# Patient Record
Sex: Male | Born: 1980 | Race: White | Hispanic: No | Marital: Single | Smoking: Current every day smoker
Health system: Southern US, Community
[De-identification: ages and names within clinical notes are randomized; demographics above are authoritative.]

## PROBLEM LIST (undated history)

## (undated) DIAGNOSIS — F209 Schizophrenia, unspecified: Secondary | ICD-10-CM

## (undated) DIAGNOSIS — T1491XA Suicide attempt, initial encounter: Secondary | ICD-10-CM

## (undated) DIAGNOSIS — I2510 Atherosclerotic heart disease of native coronary artery without angina pectoris: Secondary | ICD-10-CM

## (undated) DIAGNOSIS — F319 Bipolar disorder, unspecified: Secondary | ICD-10-CM

---

## 2004-03-03 ENCOUNTER — Emergency Department: Payer: Self-pay | Admitting: Emergency Medicine

## 2004-11-04 ENCOUNTER — Emergency Department: Payer: Self-pay | Admitting: Unknown Physician Specialty

## 2004-11-05 ENCOUNTER — Emergency Department: Payer: Self-pay | Admitting: Emergency Medicine

## 2004-11-07 ENCOUNTER — Emergency Department: Payer: Self-pay | Admitting: Emergency Medicine

## 2006-06-25 ENCOUNTER — Emergency Department: Payer: Self-pay | Admitting: Emergency Medicine

## 2007-04-09 ENCOUNTER — Emergency Department: Payer: Self-pay | Admitting: Emergency Medicine

## 2008-02-12 ENCOUNTER — Inpatient Hospital Stay: Payer: Self-pay | Admitting: General Practice

## 2008-02-12 ENCOUNTER — Other Ambulatory Visit: Payer: Self-pay

## 2008-02-12 ENCOUNTER — Ambulatory Visit: Payer: Self-pay | Admitting: Internal Medicine

## 2008-02-21 ENCOUNTER — Ambulatory Visit: Payer: Self-pay | Admitting: Podiatry

## 2008-02-22 ENCOUNTER — Ambulatory Visit: Payer: Self-pay | Admitting: Podiatry

## 2009-05-29 ENCOUNTER — Inpatient Hospital Stay: Payer: Self-pay | Admitting: Psychiatry

## 2009-06-25 ENCOUNTER — Inpatient Hospital Stay: Payer: Self-pay | Admitting: Psychiatry

## 2009-09-29 IMAGING — CT CT EXTREM LOW W/O CM*R*
1 of 2 series · 9 of 14 positions shown, 12 images · non-contrast
Comparison: none

REASON FOR EXAM: fx; 3D reconstruction please
COMMENTS:

PROCEDURE:     CT  - CT HEEL (CALCANEUS) R WO  - February 12, 2008  [DATE]
RESULT:
HISTORY: Fracture.

[Series 3: bone windows · axial · 0.45mm/px · z∈[+22,+208]mm · 9 of 78 slices shown, 12 images]
[im 8/78  soft-tissue]
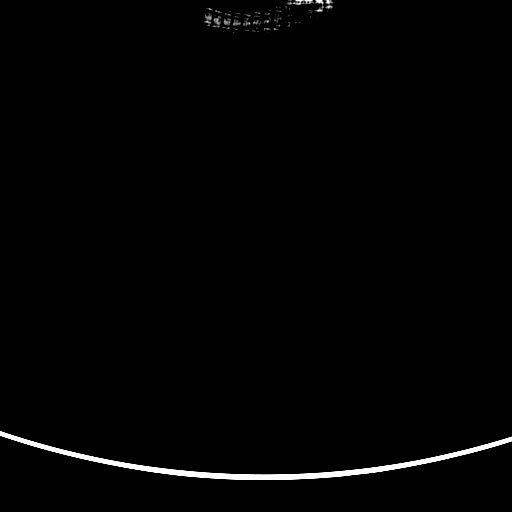
[im 8/78  bone]
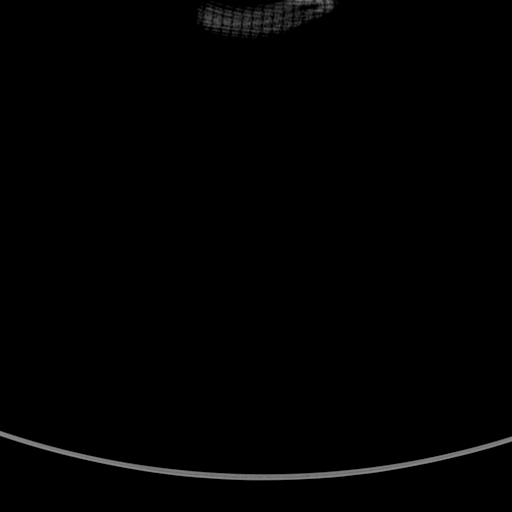
[im 16/78  bone]
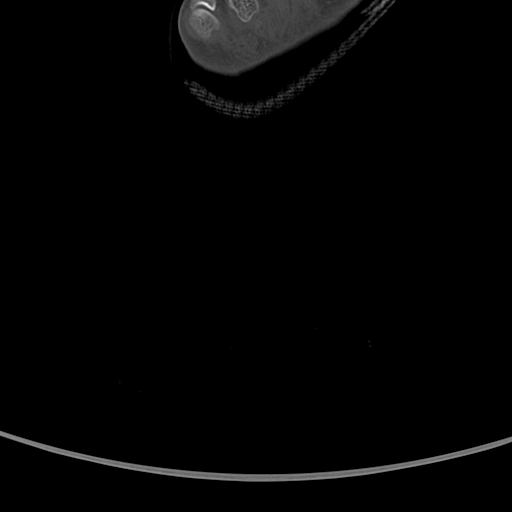
[im 24/78  bone]
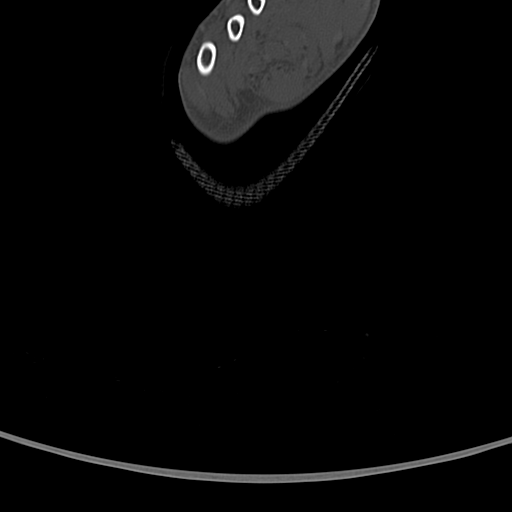
[im 31/78  bone]
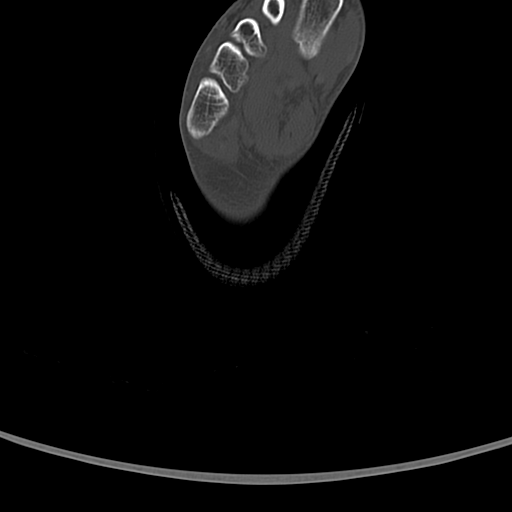
[im 39/78  soft-tissue]
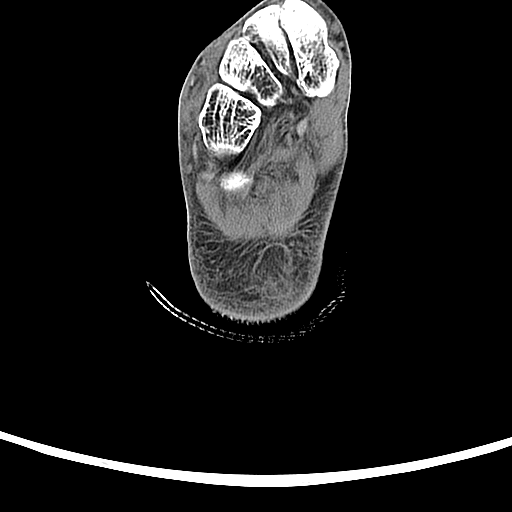
[im 39/78  bone]
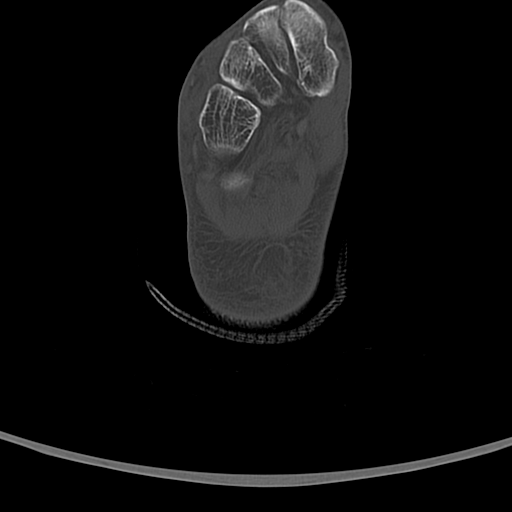
[im 47/78  bone]
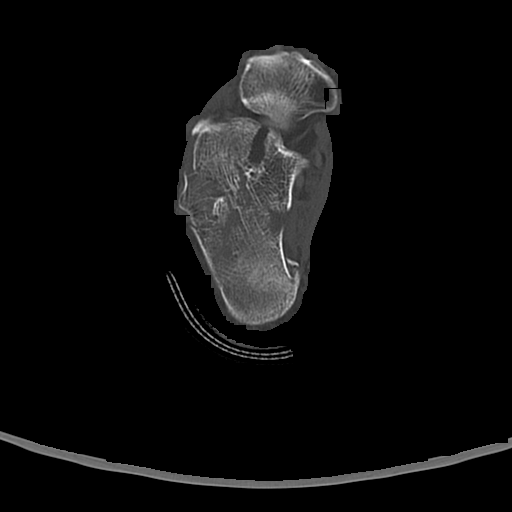
[im 54/78  bone]
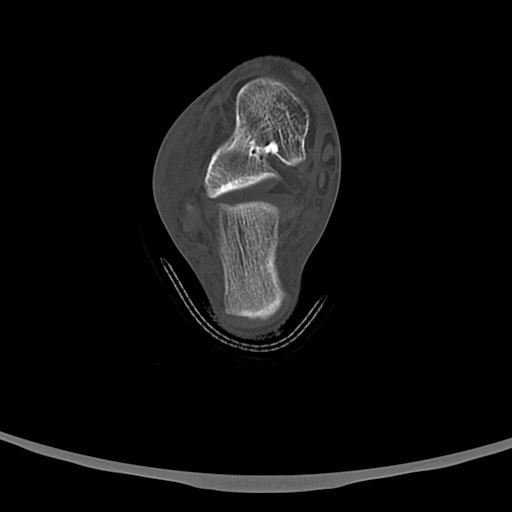
[im 62/78  bone]
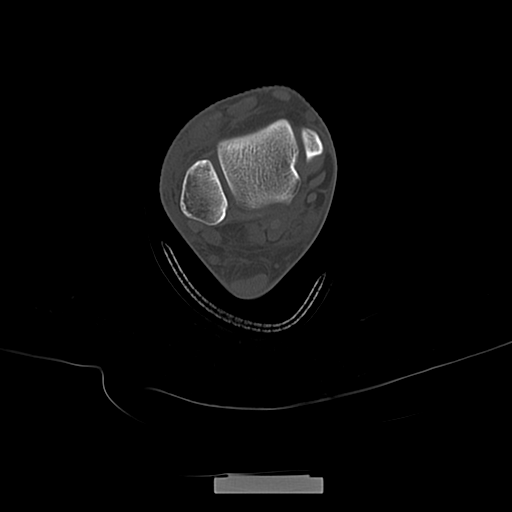
[im 70/78  soft-tissue]
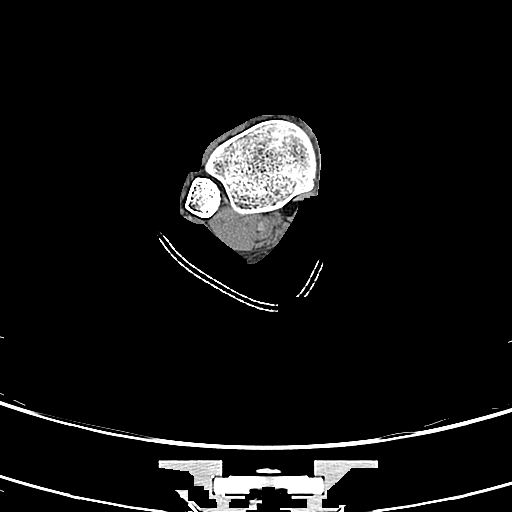
[im 70/78  bone]
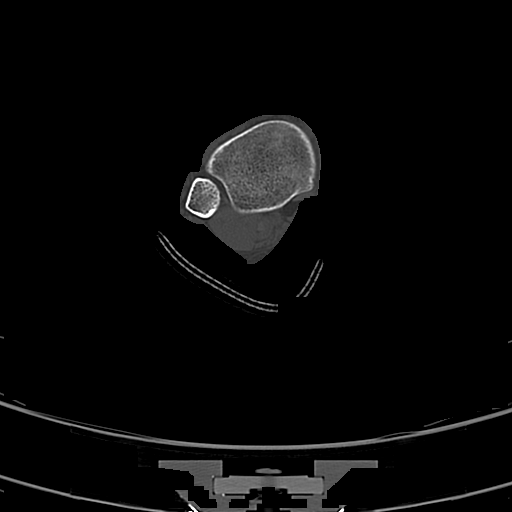

[9 of 14 positions shown; findings below may reference images not displayed]

COMPARISON STUDIES: No recent.

PROCEDURE AND FINDINGS: CT of the RIGHT calcaneus reveals a comminuted
calcaneal fracture with fracture extension into the subtalar joint and
probably into the calcaneal cuboid joint. Fracture fragments are displaced
and are present in the subtalar joint and adjacent to the peroneal tendons.
The cuboid is intact. The talus is intact. The malleoli are intact.
IMPRESSION: 1.Comminuted calcaneal fracture.

## 2010-09-18 ENCOUNTER — Emergency Department: Payer: Self-pay | Admitting: Emergency Medicine

## 2011-03-27 ENCOUNTER — Inpatient Hospital Stay: Payer: Self-pay | Admitting: Psychiatry

## 2011-05-08 ENCOUNTER — Emergency Department: Payer: Self-pay | Admitting: Emergency Medicine

## 2012-04-19 ENCOUNTER — Emergency Department: Payer: Self-pay | Admitting: Emergency Medicine

## 2013-11-17 ENCOUNTER — Emergency Department: Payer: Self-pay | Admitting: Emergency Medicine

## 2014-03-15 ENCOUNTER — Emergency Department: Payer: Self-pay | Admitting: Emergency Medicine

## 2018-08-22 ENCOUNTER — Other Ambulatory Visit: Payer: Self-pay

## 2018-08-22 ENCOUNTER — Emergency Department
Admission: EM | Admit: 2018-08-22 | Discharge: 2018-08-22 | Payer: Medicaid Other | Attending: Emergency Medicine | Admitting: Emergency Medicine

## 2018-08-22 ENCOUNTER — Encounter: Payer: Self-pay | Admitting: Emergency Medicine

## 2018-08-22 DIAGNOSIS — Y929 Unspecified place or not applicable: Secondary | ICD-10-CM | POA: Diagnosis not present

## 2018-08-22 DIAGNOSIS — S61411A Laceration without foreign body of right hand, initial encounter: Secondary | ICD-10-CM | POA: Insufficient documentation

## 2018-08-22 DIAGNOSIS — S81812A Laceration without foreign body, left lower leg, initial encounter: Secondary | ICD-10-CM | POA: Diagnosis not present

## 2018-08-22 DIAGNOSIS — F17210 Nicotine dependence, cigarettes, uncomplicated: Secondary | ICD-10-CM | POA: Insufficient documentation

## 2018-08-22 DIAGNOSIS — Y999 Unspecified external cause status: Secondary | ICD-10-CM | POA: Insufficient documentation

## 2018-08-22 DIAGNOSIS — I2510 Atherosclerotic heart disease of native coronary artery without angina pectoris: Secondary | ICD-10-CM | POA: Insufficient documentation

## 2018-08-22 DIAGNOSIS — W260XXA Contact with knife, initial encounter: Secondary | ICD-10-CM | POA: Diagnosis not present

## 2018-08-22 DIAGNOSIS — Y9389 Activity, other specified: Secondary | ICD-10-CM | POA: Diagnosis not present

## 2018-08-22 HISTORY — DX: Bipolar disorder, unspecified: F31.9

## 2018-08-22 HISTORY — DX: Schizophrenia, unspecified: F20.9

## 2018-08-22 HISTORY — DX: Suicide attempt, initial encounter: T14.91XA

## 2018-08-22 HISTORY — DX: Atherosclerotic heart disease of native coronary artery without angina pectoris: I25.10

## 2018-08-22 MED ORDER — CEPHALEXIN 500 MG PO CAPS
500.0000 mg | ORAL_CAPSULE | Freq: Once | ORAL | Status: AC
Start: 2018-08-22 — End: 2018-08-22
  Administered 2018-08-22: 500 mg via ORAL
  Filled 2018-08-22: qty 1

## 2018-08-22 MED ORDER — CEPHALEXIN 500 MG PO CAPS: 500.0000 mg | ORAL_CAPSULE | Freq: Four times a day (QID) | ORAL | 0 refills | Status: AC

## 2018-08-22 MED ORDER — LIDOCAINE HCL (PF) 1 % IJ SOLN
INTRAMUSCULAR | Status: AC
  Filled 2018-08-22: qty 10

## 2018-08-22 MED ORDER — IBUPROFEN 600 MG PO TABS
600.0000 mg | ORAL_TABLET | Freq: Once | ORAL | Status: AC
  Administered 2018-08-22: 600 mg via ORAL
  Filled 2018-08-22: qty 1

## 2018-08-22 NOTE — ED Notes (Signed)
Dr York Cerise at bedside assessing pt.
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
## 2018-08-22 NOTE — ED Notes (Signed)
Pt refused to sign discharge 
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
## 2020-12-16 ENCOUNTER — Other Ambulatory Visit: Payer: Self-pay

## 2020-12-16 ENCOUNTER — Ambulatory Visit
Admission: EM | Admit: 2020-12-16 | Discharge: 2020-12-16 | Disposition: A | Discharge status: Home / Self Care | Payer: Medicaid Other | Attending: Family Medicine | Admitting: Family Medicine

## 2020-12-16 ENCOUNTER — Encounter: Payer: Self-pay | Admitting: Emergency Medicine

## 2020-12-16 DIAGNOSIS — H66012 Acute suppurative otitis media with spontaneous rupture of ear drum, left ear: Secondary | ICD-10-CM

## 2020-12-16 MED ORDER — AMOXICILLIN-POT CLAVULANATE 875-125 MG PO TABS: 1.0000 | ORAL_TABLET | Freq: Two times a day (BID) | ORAL | 0 refills | Status: AC

## 2020-12-16 NOTE — ED Triage Notes (Signed)
Pt presents today with c/o of nasal congestion with left ear pain x 1 week. +cough (productive/green).

## 2020-12-16 NOTE — Discharge Instructions (Addendum)
Medication as prescribed.  I will be placing a referral for you to see ENT.  If you have any concerns please let us know.  Take care  Dr. Adriana Simas

## 2020-12-17 NOTE — ED Provider Notes (Signed)
[facility]    CSN: [id number] Arrival date & time: 12/16/20  1245  History   Chief Complaint Chief Complaint  Patient presents with   Nasal Congestion   Otalgia    left    HPI  40 year old male presents with the above complaints.  Patient reports a 1 week history of symptoms.  He has had ongoing congestion.  He also reports ear pain and he has recently noted blood coming from his left ear.  Also has ongoing productive cough.  No documented fever.  Additionally, patient reports that he has had frequent nosebleeds as of late.  No relieving factors.  Patient has difficulty hearing.  Pain reported as 8/10 in severity.  No other complaints or concerns at this time.  Home Medications    Prior to Admission medications   Medication Sig Start Date End Date Taking? Authorizing Provider  amoxicillin-clavulanate (AUGMENTIN) 875-125 MG tablet Take 1 tablet by mouth 2 (two) times daily. 12/16/20  Yes Tommie Sams, DO   Social History Social History   Tobacco Use   Smoking status: Every Day    Types: Cigarettes   Smokeless tobacco: Current  Substance Use Topics   Alcohol use: Yes    Alcohol/week: 8.0 - 9.0 standard drinks    Types: 8 - 9 Cans of beer per week   Drug use: Yes    Types: Marijuana     Allergies   Patient has no known allergies.   Review of Systems Review of Systems  HENT:  Positive for congestion, ear discharge, ear pain and nosebleeds.   Respiratory:  Positive for cough.     Physical Exam Triage Vital Signs ED Triage Vitals  Enc Vitals Group     BP 12/16/20 1322 (!) 141/99     Pulse Rate 12/16/20 1322 (!) 104     Resp 12/16/20 1322 18     Temp 12/16/20 1322 98.5 F (36.9 C)     Temp Source 12/16/20 1322 Oral     SpO2 12/16/20 1322 98 %     Weight --      Height --      Head Circumference --      Peak Flow --      Pain Score 12/16/20 1319 8     Pain Loc --      Pain Edu? --      Excl. in GC? --    No data found.  Updated Vital  Signs BP (!) 141/99 (BP Location: Left Arm)   Pulse (!) 104   Temp 98.5 F (36.9 C) (Oral)   Resp 18   SpO2 98%   Visual Acuity Right Eye Distance:   Left Eye Distance:   Bilateral Distance:    Right Eye Near:   Left Eye Near:    Bilateral Near:     Physical Exam Constitutional:      General: He is not in acute distress.    Appearance: Normal appearance. He is not ill-appearing.  HENT:     Head: Normocephalic and atraumatic.     Ears:     Comments: Left TM normal. Right ear -blood noted in the canal.  TM is very erythematous.  Difficult to visualize all portions of the TM secondary to blood in the canal.  Suspect perforation.    Mouth/Throat:     Pharynx: Oropharynx is clear.  Eyes:     General:        Right eye: No discharge.  Left eye: No discharge.     Conjunctiva/sclera: Conjunctivae normal.  Cardiovascular:     Rate and Rhythm: Normal rate and regular rhythm.     Heart sounds: Murmur heard.  Pulmonary:     Effort: Pulmonary effort is normal.     Breath sounds: Normal breath sounds.  Neurological:     Mental Status: He is alert.     UC Treatments / Results  Labs (all labs ordered are listed, but only abnormal results are displayed) Labs Reviewed - No data to display  EKG   Radiology No results found.  Procedures Procedures (including critical care time)  Medications Ordered in UC Medications - No data to display  Initial Impression / Assessment and Plan / UC Course  I have reviewed the triage vital signs and the nursing notes.  Pertinent labs & imaging results that were available during my care of the patient were reviewed by me and considered in my medical decision making (see chart for details).    40 year old male presents for evaluation of the above as presented in the HPI.  Physical exam notable for otitis media which has led to TM perforation.  There is blood throughout his ear canal.  Placing on Augmentin.  We will be placing  referral to ENT for follow-up and to ensure that this heals appropriately.  Final Clinical Impressions(s) / UC Diagnoses   Final diagnoses:  Acute suppurative otitis media of left ear with spontaneous rupture of tympanic membrane, recurrence not specified     Discharge Instructions      Medication as prescribed.  I will be placing a referral for you to see ENT.  If you have any concerns please let us know.  Take care  Dr. Adriana Simas    ED Prescriptions     Medication Sig Dispense Auth. Provider   amoxicillin-clavulanate (AUGMENTIN) 875-125 MG tablet Take 1 tablet by mouth 2 (two) times daily. 20 tablet Tommie Sams, DO      PDMP not reviewed this encounter.   Tommie Sams, [place] 12/17/20 1223
